# Patient Record
Sex: Female | Born: 1955 | Hispanic: Yes | Marital: Married | Smoking: Never smoker
Health system: Southern US, Community
[De-identification: ages and names within clinical notes are randomized; demographics above are authoritative.]

---

## 2007-07-15 ENCOUNTER — Ambulatory Visit: Payer: Self-pay

## 2009-02-14 ENCOUNTER — Ambulatory Visit: Payer: Self-pay | Admitting: Family Medicine

## 2011-03-04 ENCOUNTER — Ambulatory Visit: Payer: Self-pay | Admitting: Family Medicine

## 2012-04-01 ENCOUNTER — Ambulatory Visit: Payer: Self-pay

## 2013-05-05 ENCOUNTER — Ambulatory Visit: Payer: Self-pay

## 2015-03-08 ENCOUNTER — Ambulatory Visit: Payer: Self-pay | Attending: Oncology | Admitting: *Deleted

## 2015-03-08 ENCOUNTER — Ambulatory Visit
Admission: RE | Admit: 2015-03-08 | Discharge: 2015-03-08 | Disposition: A | Discharge status: Home / Self Care | Payer: Self-pay | Source: Ambulatory Visit | Attending: Oncology | Admitting: Oncology

## 2015-03-08 ENCOUNTER — Encounter: Payer: Self-pay | Admitting: *Deleted

## 2015-03-08 VITALS — BP 111/73 | HR 62 | Temp 97.7°F | Resp 18 | Ht 59.06 in | Wt 161.4 lb

## 2015-03-08 DIAGNOSIS — Z0000 Encounter for general adult medical examination without abnormal findings: Secondary | ICD-10-CM

## 2015-03-08 NOTE — Patient Instructions (Signed)
Gave patient hand-out, Women Staying Healthy, Active and Well from [facility], with education on breast health, pap smears, heart and colon health. 

## 2015-03-08 NOTE — Progress Notes (Signed)
Subjective:     Patient ID: Amanda Johns, female   DOB: 1955/08/09, 60 y.o.   MRN: [id number]  HPI   Review of Systems     Objective:   Physical Exam  Pulmonary/Chest: Right breast exhibits no inverted nipple, no mass, no nipple discharge, no skin change and no tenderness. Left breast exhibits no inverted nipple, no mass, no nipple discharge, no skin change and no tenderness. Breasts are symmetrical.         Assessment:     60 year old Hispanic female returns to [facility] for annual screening.  Maritza, the interpreter present during the interview and exam.  Clinical breast exam unremarkable.  Taught self breast awareness.  Patient has been screened for eligibility.  She does not have any insurance, Medicare or Medicaid.  She also meets financial eligibility.  Hand-out given on the Affordable Care Act.     Plan:     Screening mammogram ordered.  Will follow-up per protocol.

## 2015-03-17 ENCOUNTER — Encounter: Payer: Self-pay | Admitting: *Deleted

## 2015-03-17 NOTE — Progress Notes (Signed)
Letter mailed to inform patient of her normal mammogram results.  To follow up with annual screening in one year.  [facility] to [place].

## 2016-05-01 ENCOUNTER — Encounter: Payer: Self-pay | Admitting: *Deleted

## 2016-05-01 ENCOUNTER — Ambulatory Visit
Admission: RE | Admit: 2016-05-01 | Discharge: 2016-05-01 | Disposition: A | Discharge status: Home / Self Care | Payer: Self-pay | Source: Ambulatory Visit | Attending: Oncology | Admitting: Oncology

## 2016-05-01 ENCOUNTER — Ambulatory Visit: Payer: Self-pay | Attending: Oncology | Admitting: *Deleted

## 2016-05-01 VITALS — BP 138/76 | HR 66 | Temp 96.8°F | Ht 58.27 in | Wt 156.6 lb

## 2016-05-01 DIAGNOSIS — Z0000 Encounter for general adult medical examination without abnormal findings: Secondary | ICD-10-CM

## 2016-05-01 NOTE — Patient Instructions (Signed)
Gave patient hand-out, Women Staying Healthy, Active and Well from [facility], with education on breast health, pap smears, heart and colon health. 

## 2016-05-01 NOTE — Progress Notes (Signed)
Subjective:     Patient ID: Amanda SkinnerFelipa Tornero Johns, female   DOB: 09-04-1955, 61 y.o.   MRN: [id number]  HPI   Review of Systems     Objective:   Physical Exam  Pulmonary/Chest: Right breast exhibits no inverted nipple, no mass, no nipple discharge, no skin change and no tenderness. Left breast exhibits no inverted nipple, no mass, no nipple discharge, no skin change and no tenderness. Breasts are asymmetrical.  Pendulous breast noted.  Left breast larger than the right.       Assessment:     61 year old Hispanic [facility]  Kandis CockingMaritza, the interpreter present during the interview and exam.  Clinical breast exam unremarkable.  Taught self breast awareness.  [facility] 2017 [facility].  Patient has been screened for eligibility.  She does not have any insurance, Medicare or Medicaid.  She also meets financial eligibility.  Hand-out given on the Affordable Care Act.    Plan:     Screening mammogram ordered.  Will follow up per [facility] protocol.

## 2016-05-07 ENCOUNTER — Encounter: Payer: Self-pay | Admitting: *Deleted

## 2016-05-07 NOTE — Progress Notes (Signed)
Letter mailed from the [facility] to inform patient of her normal mammogram results.  Patient is to follow-up with annual screening in one year.  [facility] to [place]. 

## 2018-06-10 IMAGING — MG MM DIGITAL SCREENING BILAT W/ TOMO W/ CAD
6 series · 6 of 14 positions shown · non-contrast
Comparison: Previous exam(s).

CLINICAL DATA: Screening.

EXAM:
2D DIGITAL SCREENING BILATERAL MAMMOGRAM WITH CAD AND ADJUNCT TOMO

[L CC]
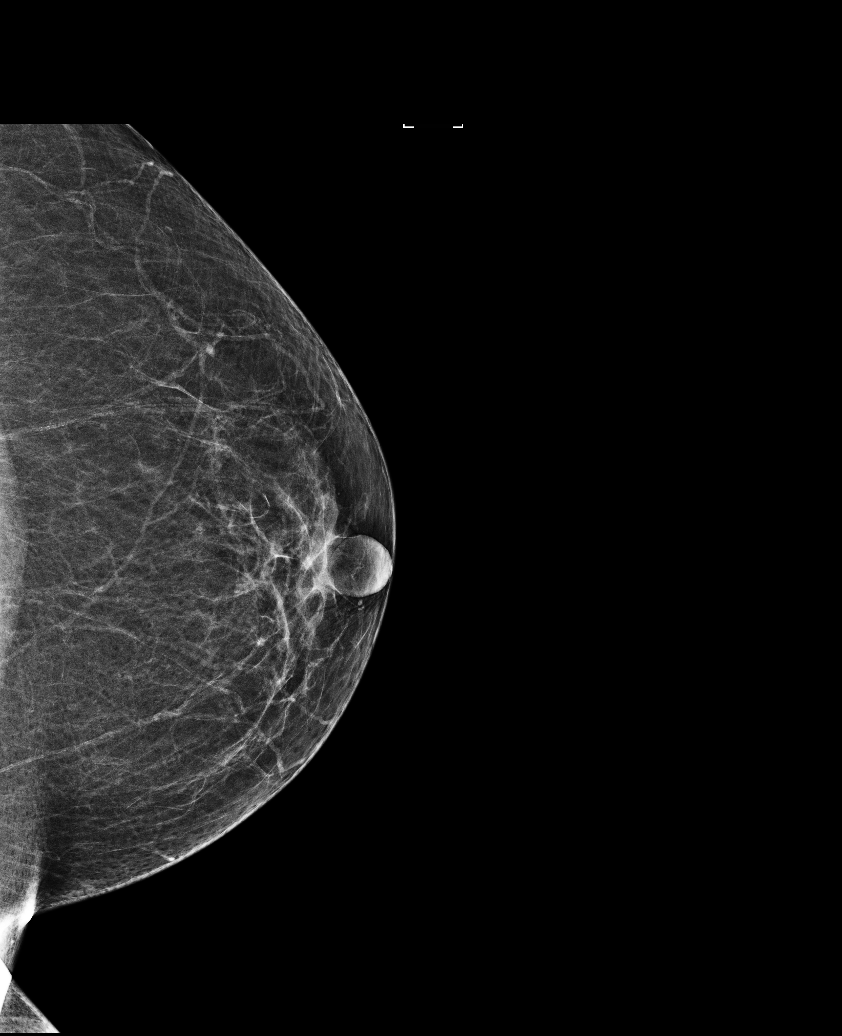

[R CC synth-2D]
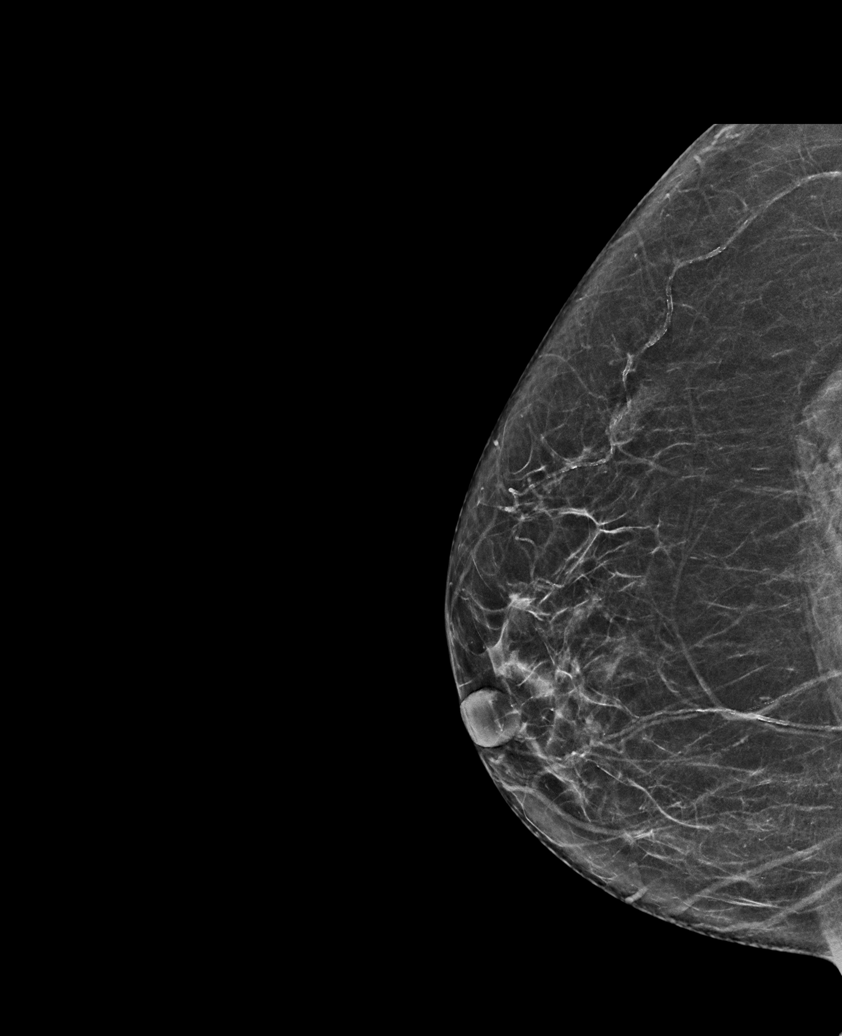

[R MLO synth-2D]
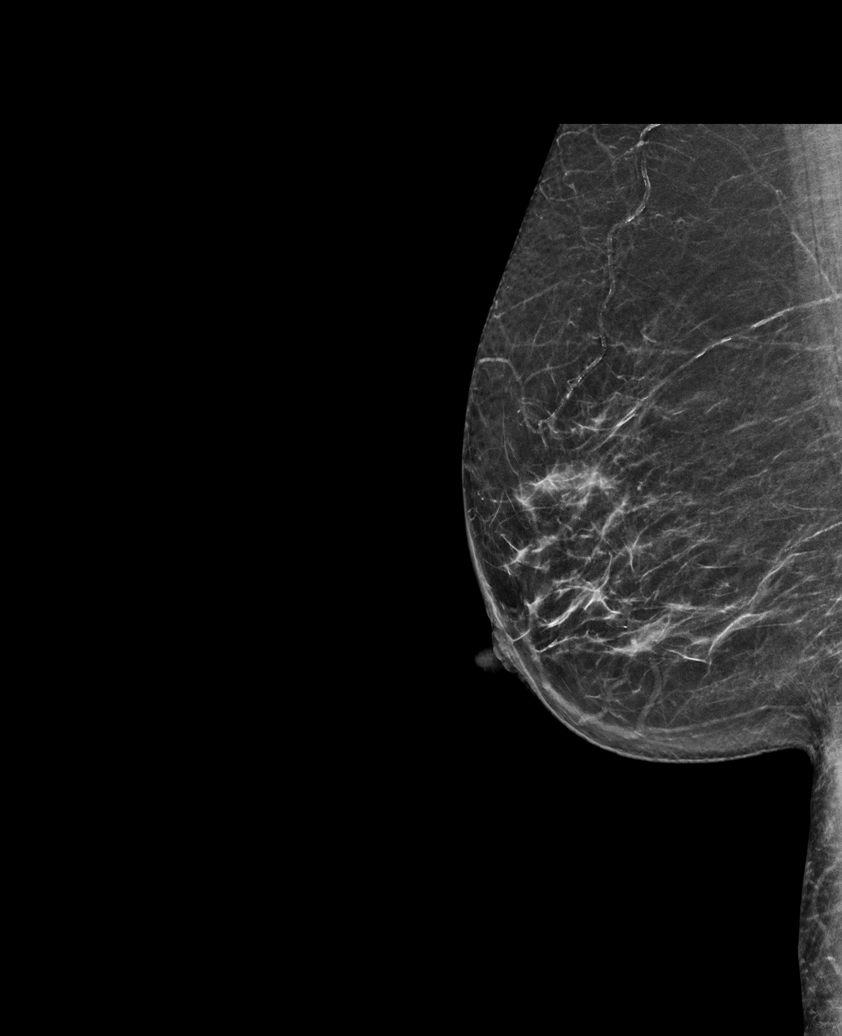

[R CC]
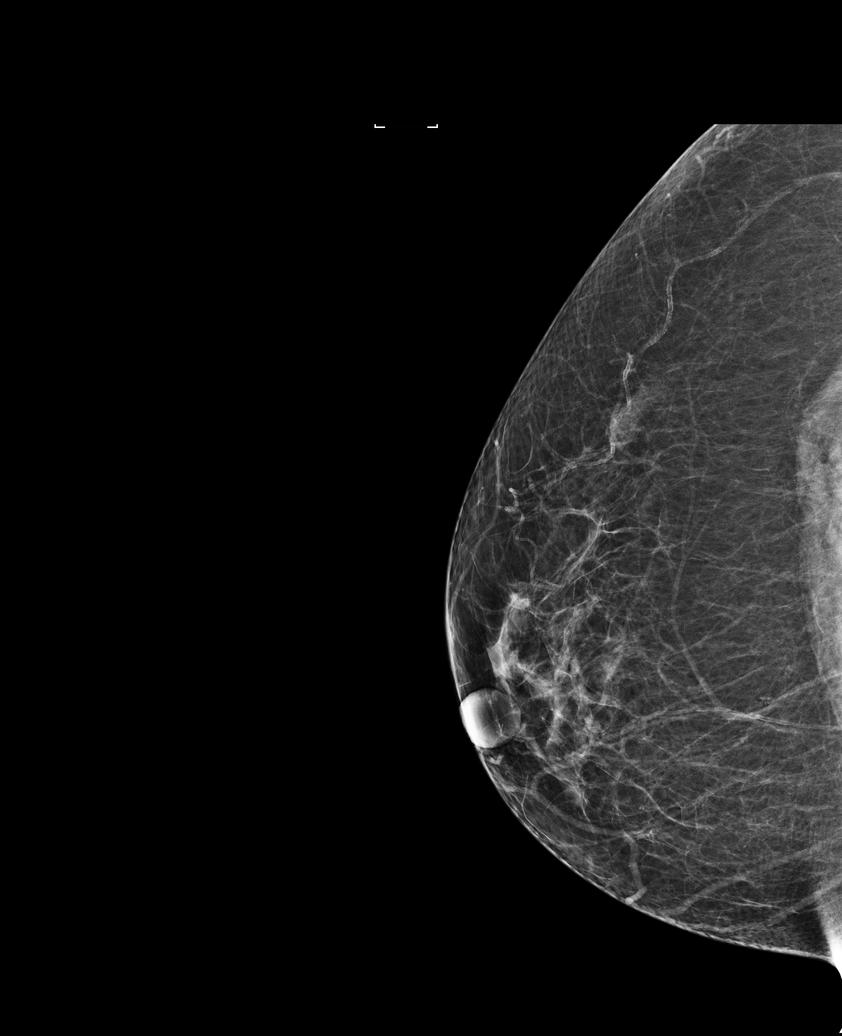

[L CC tomo · tomo slice 33/64.0]
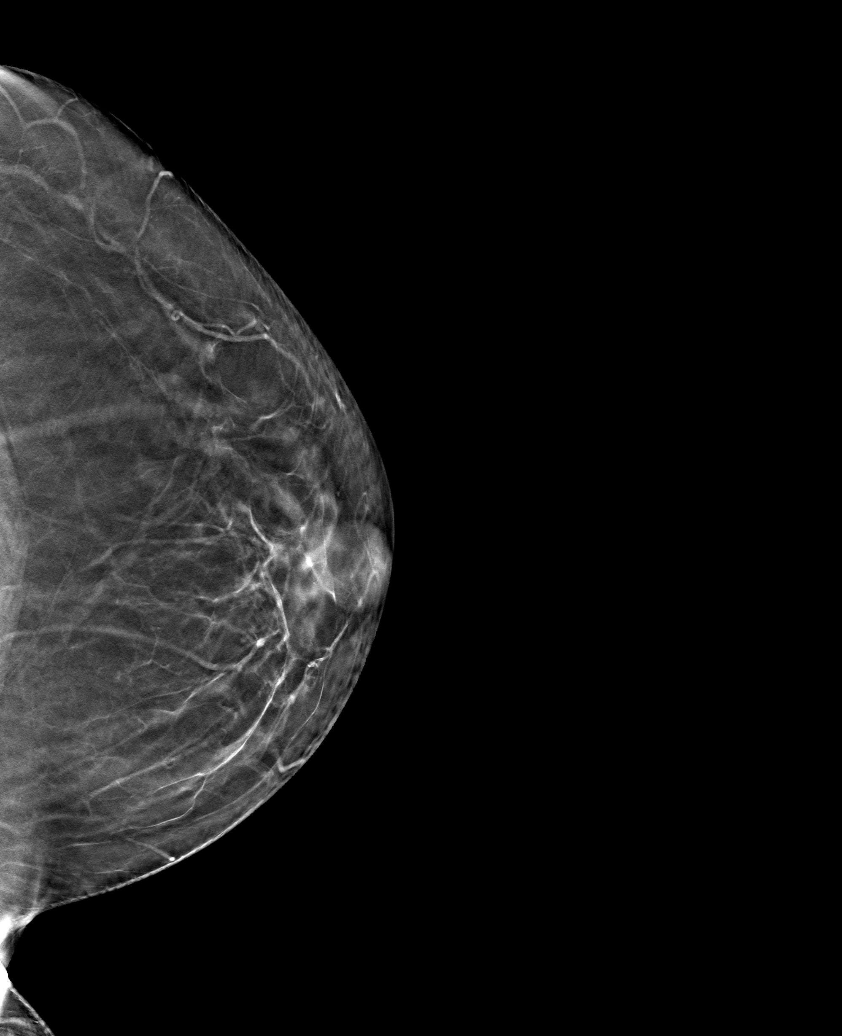

[R CC tomo · tomo slice 32/63.0]
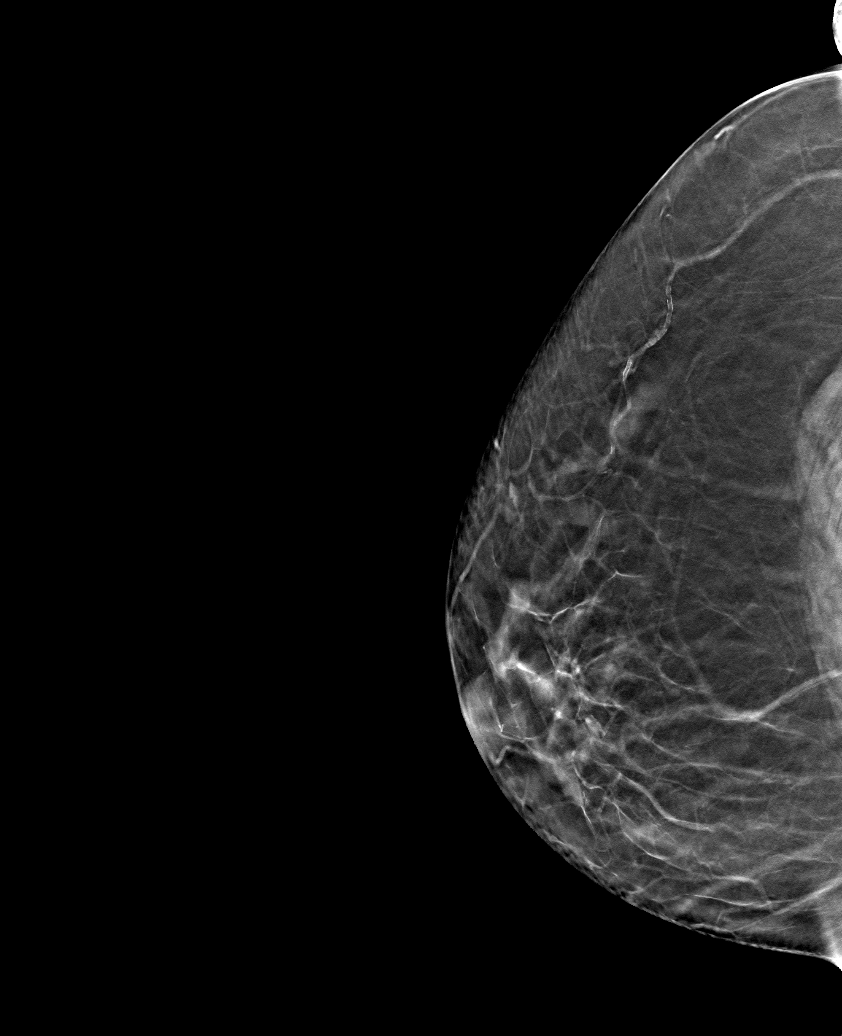

[6 of 14 positions shown; findings below may reference images not displayed]

ACR Breast Density Category b: There are scattered areas of
fibroglandular density.
FINDINGS: There are no findings suspicious for malignancy. Images were
processed with CAD.
IMPRESSION: No mammographic evidence of malignancy. A result letter of this
screening mammogram will be mailed directly to the patient.

RECOMMENDATION:
Screening mammogram in one year. (Code:97-6-RS4)

BI-RADS CATEGORY  1: Negative.

## 2019-06-09 ENCOUNTER — Ambulatory Visit
Admission: RE | Admit: 2019-06-09 | Discharge: 2019-06-09 | Disposition: A | Discharge status: Home / Self Care | Payer: Self-pay | Source: Ambulatory Visit | Attending: Oncology | Admitting: Oncology

## 2019-06-09 ENCOUNTER — Other Ambulatory Visit: Payer: Self-pay

## 2019-06-09 ENCOUNTER — Ambulatory Visit: Payer: Self-pay | Attending: Oncology

## 2019-06-09 VITALS — BP 116/75 | HR 68 | Temp 97.1°F | Ht 58.25 in | Wt 167.5 lb

## 2019-06-09 DIAGNOSIS — Z0000 Encounter for general adult medical examination without abnormal findings: Secondary | ICD-10-CM

## 2019-06-09 NOTE — Progress Notes (Signed)
  Subjective:     Patient ID: Amanda Johns, female   DOB: Sep 01, 1955, 64 y.o.   MRN: [id number]  HPI   Review of Systems     Objective:   Physical Exam Chest:     Breasts:        Right: No swelling, bleeding, inverted nipple, mass, nipple discharge, skin change or tenderness.        Left: No swelling, bleeding, inverted nipple, mass, nipple discharge, skin change or tenderness.  Genitourinary:    Labia:        Right: No rash, tenderness, lesion or injury.        Left: No rash, tenderness, lesion or injury.      Vagina: No signs of injury and foreign body. Vaginal discharge present. No erythema, tenderness, bleeding, lesions or prolapsed vaginal walls.     Cervix: No cervical motion tenderness, friability, erythema or eversion.     Uterus: Not deviated, not enlarged, not fixed, not tender and no uterine prolapse.      Adnexa:        Right: No mass, tenderness or fullness.         Left: No mass, tenderness or fullness.       Comments: Light brown vaginal discharge       Assessment:     64 year old Hispanic female patient presents for [facility] clinic visit.  Patient screened, and meets [facility] eligibility.  Patient does not have insurance, Medicare or Medicaid.  Instructed patient on breast self awareness using teach back method.  Clinical breast exam unremarkable.  Noted light brown liquid discharge on pelvic exam.      Plan:     Sent for bilateral screening mammogram. Specimen collected for pap.

## 2019-06-10 NOTE — Progress Notes (Unsigned)
Letter mailed from [facility] to notify of normal mammogram results.  Patient to return in one year for annual screening.  Pap results pending. 

## 2019-06-12 LAB — IGP, APTIMA HPV: HPV Aptima: NEGATIVE

## 2019-06-14 NOTE — Progress Notes (Signed)
Letter mailed to patient to notify of normal mammogram, and pap smear results. Next pap due in 5 years.  Copy to [facility]. 

## 2021-12-31 ENCOUNTER — Ambulatory Visit
Admission: RE | Admit: 2021-12-31 | Discharge: 2021-12-31 | Disposition: A | Discharge status: Home / Self Care | Payer: Self-pay | Source: Ambulatory Visit | Attending: Physician Assistant | Admitting: Physician Assistant

## 2021-12-31 ENCOUNTER — Ambulatory Visit
Admission: RE | Admit: 2021-12-31 | Discharge: 2021-12-31 | Disposition: A | Discharge status: Home / Self Care | Payer: Self-pay | Attending: Physician Assistant | Admitting: Physician Assistant

## 2021-12-31 ENCOUNTER — Other Ambulatory Visit: Payer: Self-pay | Admitting: Physician Assistant

## 2021-12-31 DIAGNOSIS — M25812 Other specified joint disorders, left shoulder: Secondary | ICD-10-CM

## 2022-03-23 ENCOUNTER — Emergency Department: Payer: Self-pay

## 2022-03-23 ENCOUNTER — Other Ambulatory Visit: Payer: Self-pay

## 2022-03-23 ENCOUNTER — Encounter: Payer: Self-pay | Admitting: Emergency Medicine

## 2022-03-23 ENCOUNTER — Emergency Department
Admission: EM | Admit: 2022-03-23 | Discharge: 2022-03-23 | Disposition: A | Discharge status: Home / Self Care | Payer: Self-pay | Attending: Emergency Medicine | Admitting: Emergency Medicine

## 2022-03-23 DIAGNOSIS — R42 Dizziness and giddiness: Secondary | ICD-10-CM | POA: Insufficient documentation

## 2022-03-23 DIAGNOSIS — R8289 Other abnormal findings on cytological and histological examination of urine: Secondary | ICD-10-CM | POA: Insufficient documentation

## 2022-03-23 LAB — BASIC METABOLIC PANEL WITH GFR
Anion gap: 9 (ref 5–15)
BUN: 21 mg/dL (ref 8–23)
CO2: 26 mmol/L (ref 22–32)
Calcium: 9.2 mg/dL (ref 8.9–10.3)
Chloride: 101 mmol/L (ref 98–111)
Creatinine, Ser: 0.59 mg/dL (ref 0.44–1.00)
GFR, Estimated: >60 mL/min
Glucose, Bld: 102 mg/dL — ABNORMAL HIGH (ref 70–99)
Potassium: 3.5 mmol/L (ref 3.5–5.1)
Sodium: 136 mmol/L (ref 135–145)

## 2022-03-23 LAB — URINALYSIS, ROUTINE W REFLEX MICROSCOPIC
Bacteria, UA: NONE SEEN
Bilirubin Urine: NEGATIVE
Glucose, UA: NEGATIVE mg/dL
Ketones, ur: 5 mg/dL — AB
Nitrite: NEGATIVE
Protein, ur: NEGATIVE mg/dL
Specific Gravity, Urine: 1.004 — ABNORMAL LOW (ref 1.005–1.030)
pH: 7 (ref 5.0–8.0)

## 2022-03-23 LAB — CBC
HCT: 38.1 % (ref 36.0–46.0)
Hemoglobin: 12.8 g/dL (ref 12.0–15.0)
MCH: 30.1 pg (ref 26.0–34.0)
MCHC: 33.6 g/dL (ref 30.0–36.0)
MCV: 89.6 fL (ref 80.0–100.0)
Platelets: 344 K/uL (ref 150–400)
RBC: 4.25 MIL/uL (ref 3.87–5.11)
RDW: 12.3 % (ref 11.5–15.5)
WBC: 6.5 K/uL (ref 4.0–10.5)
nRBC: 0 % (ref 0.0–0.2)

## 2022-03-23 LAB — TROPONIN I (HIGH SENSITIVITY): Troponin I (High Sensitivity): 4 ng/L (ref <18)

## 2022-03-23 LAB — BRAIN NATRIURETIC PEPTIDE: B Natriuretic Peptide: 37.7 pg/mL (ref 0.0–100.0)

## 2022-03-23 MED ORDER — ONDANSETRON HCL 4 MG/2ML IJ SOLN
4.0000 mg | Freq: Once | INTRAMUSCULAR | Status: AC
  Administered 2022-03-23: 4 mg via INTRAVENOUS
  Filled 2022-03-23: qty 2

## 2022-03-23 MED ORDER — SODIUM CHLORIDE 0.9 % IV BOLUS
1000.0000 mL | Freq: Once | INTRAVENOUS | Status: AC
  Administered 2022-03-23: 1000 mL via INTRAVENOUS

## 2022-03-23 MED ORDER — IOHEXOL 350 MG/ML SOLN
75.0000 mL | Freq: Once | INTRAVENOUS | Status: AC | PRN
  Administered 2022-03-23: 75 mL via INTRAVENOUS

## 2022-03-23 MED ORDER — PROMETHAZINE HCL 12.5 MG PO TABS: 12.5000 mg | ORAL_TABLET | Freq: Four times a day (QID) | ORAL | 0 refills | Status: AC | PRN

## 2022-03-23 NOTE — ED Provider Notes (Signed)
[facility] Provider Note    Event Date/Time   First MD Initiated Contact with Patient 03/23/22 1531     (approximate)   History   Chief Complaint Dizziness   HPI Amanda Johns is a 67 y.o. female, presents to the emergency department for evaluation of dizziness.  She states her symptoms have been on and off for the past 3 days.  Reports an intermittent ringing in her right ear as well.  She states that it occurs at random.  She went to urgent care this morning, where they gave her 3 different medications, however she is not sure what they are and is unsure what they were for.  She has not taken any of the medications and does not have them with her.  She was told that she may have "inflammation in the blood vessels of her ear".  They also provide her with a influenza shot today as well.  Since then, she was at the grocery store when she began to feel "drunk" and was unable to walk anymore.  This was associated with nausea/vomiting as well.  She states that she currently feels okay, however the symptoms continue to come and go.  Reports some mild blurred vision as well.  Denies fever/chills, chest pain, shortness of breath, abdominal pain, flank pain, diarrhea, urinary symptoms, headache, restless lesions, paresthesias, or cold sensation.  History Limitations: Spanish speaking.        Physical Exam  Triage Vital Signs: ED Triage Vitals  Enc Vitals Group     BP 03/23/22 1422 (!) 160/90     Pulse Rate 03/23/22 1422 64     Resp 03/23/22 1422 18     Temp 03/23/22 1422 98.3 F (36.8 C)     Temp Source 03/23/22 1422 Oral     SpO2 03/23/22 1422 93 %     Weight --      Height --      Head Circumference --      Peak Flow --      Pain Score 03/23/22 1424 0     Pain Loc --      Pain Edu? --      Excl. in [place]? --     Most recent vital signs: Vitals:   03/23/22 1630 03/23/22 1700  BP: (!) 140/75 125/71  Pulse: 69 74  Resp: 16 19  Temp:    SpO2:  99% 100%    General: Awake, NAD.  Skin: Warm, dry. No rashes or lesions.  Eyes: PERRL. Conjunctivae normal.  Ears: No abnormalities noted on ear exam bilaterally.  No erythema or effusions along the TMs. CV: Good peripheral perfusion.  Resp: Normal effort.  Lung sounds clear bilaterally. Abd: Soft, non-tender. No distention.  Neuro: At baseline. No gross neurological deficits.  No facial droop.  Cranial nerves II through XII intact.  Extraocular movement test showed some horizontal nystagmus.  Test of skew normal.  Head impulse test normal.  Finger-nose testing normal, the patient did appear to have some difficulty performing the task.  4/5 strength in both upper and lower extremities.  Sensation intact in all extremities.  Dix-Hallpike maneuver did not seem to elicit symptoms. Musculoskeletal: Normal ROM of all extremities.   Physical Exam    ED Results / Procedures / Treatments  Labs (all labs ordered are listed, but only abnormal results are displayed) Labs Reviewed  BASIC METABOLIC PANEL - Abnormal; Notable for the following components:      Result Value  Glucose, Bld 102 (*)    All other components within normal limits  URINALYSIS, ROUTINE W REFLEX MICROSCOPIC - Abnormal; Notable for the following components:   Color, Urine STRAW (*)    APPearance CLEAR (*)    Specific Gravity, Urine 1.004 (*)    Hgb urine dipstick SMALL (*)    Ketones, ur 5 (*)    Leukocytes,Ua SMALL (*)    All other components within normal limits  CBC  BRAIN NATRIURETIC PEPTIDE  TROPONIN I (HIGH SENSITIVITY)     EKG Sinus rhythm with arrhythmia, rate of 70, no ST segment changes, normal QRS, no QT prolongation, no significant axis deviations.    RADIOLOGY  ED Provider Interpretation: I personally reviewed and interpreted these images.  Chest x-ray shows no acute abnormalities.  CT angio shows no acute intracranial abnormalities.  CT ANGIO HEAD NECK W WO CM  Result Date: 03/23/2022 CLINICAL  DATA:  Vertigo EXAM: CT ANGIOGRAPHY HEAD AND NECK TECHNIQUE: Multidetector CT imaging of the head and neck was performed using the standard protocol during bolus administration of intravenous contrast. Multiplanar CT image reconstructions and MIPs were obtained to evaluate the vascular anatomy. Carotid stenosis measurements (when applicable) are obtained utilizing NASCET criteria, using the distal internal carotid diameter as the denominator. RADIATION DOSE REDUCTION: This exam was performed according to the departmental dose-optimization program which includes automated exposure control, adjustment of the mA and/or kV according to patient size and/or use of iterative reconstruction technique. CONTRAST:  6mL OMNIPAQUE IOHEXOL 350 MG/ML SOLN COMPARISON:  None Available. FINDINGS: CT HEAD FINDINGS Brain: No evidence of acute infarction, hemorrhage, hydrocephalus, extra-axial collection or mass lesion/mass effect. Enlarged empty. Vascular: See below Skull: Normal. Negative for fracture or focal lesion. Sinuses/Orbits: No mastoid or middle ear effusion. Paranasal sinuses are clear. Orbits are unremarkable. Other: None Review of the MIP images confirms the above findings CTA NECK FINDINGS Aortic arch: Standard branching. Imaged portion shows no evidence of aneurysm or dissection. No significant stenosis of the major arch vessel origins. Right carotid system: No evidence of dissection, stenosis (50% or greater), or occlusion. Left carotid system: No evidence of dissection, stenosis (50% or greater), or occlusion. Vertebral arteries: Codominant. No evidence of dissection, stenosis (50% or greater), or occlusion. Skeleton: Large periapical lucency along the left lateral maxillary incisor. Other neck: Negative. Upper chest: Negative. Review of the MIP images confirms the above findings CTA HEAD FINDINGS Anterior circulation: No significant stenosis, proximal occlusion, aneurysm, or vascular malformation. Posterior  circulation: The basilar artery is small in caliber. No significant stenosis, vessel occlusion aneurysm or vascular malformation. Venous sinuses: As permitted by contrast timing, patent. Anatomic variants: P2 segment of the left PCA is predominantly supplied by the left PCOM. Left P1 segment is absent. Review of the MIP images confirms the above findings IMPRESSION: 1. No acute intracranial abnormality. 2. No intracranial large vessel occlusion or significant stenosis. 3. No hemodynamically significant stenosis in the neck. Electronically Signed   By: Marin Roberts M.D.   On: 03/23/2022 18:14   DG Chest 2 View  Result Date: 03/23/2022 CLINICAL DATA:  Dizziness and weakness starting this afternoon. EXAM: CHEST - 2 VIEW COMPARISON:  None Available. FINDINGS: Shallow inspiration. Heart size and pulmonary vascularity are normal. Lungs are clear. No pleural effusions. No pneumothorax. Mediastinal contours appear intact. IMPRESSION: No active cardiopulmonary disease. Electronically Signed   By: Lucienne Capers M.D.   On: 03/23/2022 17:10    PROCEDURES:  Critical Care performed: N/A.  Procedures    MEDICATIONS  ORDERED IN ED: Medications  sodium chloride 0.9 % bolus 1,000 mL (0 mLs Intravenous Stopped 03/23/22 1845)  ondansetron (ZOFRAN) injection 4 mg (4 mg Intravenous Given 03/23/22 1615)  iohexol (OMNIPAQUE) 350 MG/ML injection 75 mL (75 mLs Intravenous Contrast Given 03/23/22 1753)     IMPRESSION / MDM / ASSESSMENT AND PLAN / ED COURSE  I reviewed the triage vital signs and the nursing notes.                              Differential diagnosis includes, but is not limited to, BPPV, vestibular neuritis, labyrinthitis, Mnire disease, posterior stroke, vestibular migraine, dehydration  ED Course Patient appears well, vitals within normal limits.  NAD.  CBC shows no leukocytosis or anemia.  BMP shows no electrolyte abnormalities or AKI.  Initial troponin 4.  EKG unremarkable.  BMP  unremarkable at 37.7.  Urinalysis shows small leukocytes, no bacteria.  In the absence of urinary symptoms, unlikely to suggest infection.  Assessment/Plan Patient presents with intermittent episodes of dizziness/vertigo associated with tinnitus in her right ear x 3 days.  She appears well clinically.  She does not have any neurological deficits to suggest TIA/CVA.  CT angiogram was reassuring.  Lab workup is overall unremarkable.  She does have some horizontal nystagmus, though does not appear to be symptomatic with Dix-Hallpike maneuver.  Suspect possible Mnire disease versus vestibular neuritis.  Nonetheless, there does not appear to be any serious or life-threatening pathology.  She was able to produce the medicine as prescribed to her, which was prednisone.  Advised her to continue taking this.  Will additionally provide her with a prescription for promethazine to help manage her symptoms.  Advised her to follow-up with ENT if her symptoms fail to improve after a few weeks.  She was amenable to this.  Will discharge.  Considered admission for this patient, but given her stable presentation and unremarkable workup, she is unlikely benefit from admission.  Provided the patient with anticipatory guidance, return precautions, and educational material. Encouraged the patient to return to the emergency department at any time if they begin to experience any new or worsening symptoms. Patient expressed understanding and agreed with the plan.   Patient's presentation is most consistent with acute complicated illness / injury requiring diagnostic workup.       FINAL CLINICAL IMPRESSION(S) / ED DIAGNOSES   Final diagnoses:  Vertigo     Rx / DC Orders   ED Discharge Orders          Ordered    promethazine (PHENERGAN) 12.5 MG tablet  Every 6 hours PRN        03/23/22 1901             Note:  This document was prepared using Dragon voice recognition software and may include  unintentional dictation errors.   Teodoro Spray, [place] 03/23/22 1901    Lucillie Garfinkel, MD 03/23/22 [phone]

## 2022-03-23 NOTE — Discharge Instructions (Addendum)
-  I suspect that your dizziness is likely related to inflammation in your inner ear.  This will likely resolve on its own.  Please continue taking the prednisone.  You may additionally take the promethazine for your symptoms.  If your symptoms fail to improve, you may follow-up with the ENT specialist listed on this page.  -Return to the emergency department anytime if you begin to experience any new or worsening symptoms.

## 2022-03-23 NOTE — ED Triage Notes (Addendum)
Patient to ED via POV for dizziness and weakness that started this afternoon. States this has happen before but gone away previously. Was seen at [facility] this AM for ear problems. Patient appears pale in color.  Ipad interpreter used for this triage.

## 2022-03-23 NOTE — ED Notes (Signed)
Pt to ED POV with husband for nausea and dizziness since about 30-60 minutes ago. This has happened several times in the past, pt also has R ear problem (wax buildup) and was seen this AM at outpt clinic for this and also received influenza vaccine this morning. Pt was just dry heaving a few minutes ago.  Appears pale. Spanish speaking. Denies pain. No SOB.

## 2022-08-16 ENCOUNTER — Emergency Department: Payer: Self-pay

## 2022-08-16 ENCOUNTER — Other Ambulatory Visit: Payer: Self-pay

## 2022-08-16 ENCOUNTER — Emergency Department
Admission: EM | Admit: 2022-08-16 | Discharge: 2022-08-16 | Disposition: A | Discharge status: Home / Self Care | Payer: Self-pay | Attending: Emergency Medicine | Admitting: Emergency Medicine

## 2022-08-16 DIAGNOSIS — R42 Dizziness and giddiness: Secondary | ICD-10-CM | POA: Insufficient documentation

## 2022-08-16 LAB — DIFFERENTIAL
Abs Immature Granulocytes: 0.03 10*3/uL (ref 0.00–0.07)
Basophils Absolute: 0.1 10*3/uL (ref 0.0–0.1)
Basophils Relative: 1 %
Eosinophils Absolute: 0.1 10*3/uL (ref 0.0–0.5)
Eosinophils Relative: 2 %
Immature Granulocytes: 0 %
Lymphocytes Relative: 31 %
Lymphs Abs: 2.1 10*3/uL (ref 0.7–4.0)
Monocytes Absolute: 0.4 10*3/uL (ref 0.1–1.0)
Monocytes Relative: 5 %
Neutro Abs: 4.1 10*3/uL (ref 1.7–7.7)
Neutrophils Relative %: 61 %

## 2022-08-16 LAB — PROTIME-INR
INR: 1 (ref 0.8–1.2)
Prothrombin Time: 13 seconds (ref 11.4–15.2)

## 2022-08-16 LAB — COMPREHENSIVE METABOLIC PANEL
ALT: 21 U/L (ref 0–44)
AST: 30 U/L (ref 15–41)
Albumin: 4.3 g/dL (ref 3.5–5.0)
Alkaline Phosphatase: 89 U/L (ref 38–126)
Anion gap: 9 (ref 5–15)
BUN: 19 mg/dL (ref 8–23)
CO2: 26 mmol/L (ref 22–32)
Calcium: 9.4 mg/dL (ref 8.9–10.3)
Chloride: 104 mmol/L (ref 98–111)
Creatinine, Ser: 0.68 mg/dL (ref 0.44–1.00)
GFR, Estimated: >60 mL/min (ref >60)
Glucose, Bld: 96 mg/dL (ref 70–99)
Potassium: 3.8 mmol/L (ref 3.5–5.1)
Sodium: 139 mmol/L (ref 135–145)
Total Bilirubin: 0.7 mg/dL (ref 0.3–1.2)
Total Protein: 7.6 g/dL (ref 6.5–8.1)

## 2022-08-16 LAB — CBC
HCT: 39.7 % (ref 36.0–46.0)
Hemoglobin: 13.1 g/dL (ref 12.0–15.0)
MCH: 30.1 pg (ref 26.0–34.0)
MCHC: 33 g/dL (ref 30.0–36.0)
MCV: 91.3 fL (ref 80.0–100.0)
Platelets: 310 10*3/uL (ref 150–400)
RBC: 4.35 MIL/uL (ref 3.87–5.11)
RDW: 12.2 % (ref 11.5–15.5)
WBC: 6.8 10*3/uL (ref 4.0–10.5)
nRBC: 0 % (ref 0.0–0.2)

## 2022-08-16 LAB — APTT: aPTT: 25 seconds (ref 24–36)

## 2022-08-16 LAB — ETHANOL: Alcohol, Ethyl (B): <10 mg/dL (ref <10)

## 2022-08-16 MED ORDER — FLUTICASONE PROPIONATE 50 MCG/ACT NA SUSP: 1.0000 | Freq: Two times a day (BID) | NASAL | 3 refills | Status: AC

## 2022-08-16 MED ORDER — MECLIZINE HCL 50 MG PO TABS: 50.0000 mg | ORAL_TABLET | Freq: Three times a day (TID) | ORAL | 3 refills | Status: AC | PRN

## 2022-08-16 MED ORDER — TRIAMCINOLONE ACETONIDE 0.1 % EX CREA: 1.0000 | TOPICAL_CREAM | Freq: Four times a day (QID) | CUTANEOUS | 0 refills | Status: AC

## 2022-08-16 MED ORDER — SODIUM CHLORIDE 0.9% FLUSH: 3.0000 mL | Freq: Once | INTRAVENOUS | Status: DC

## 2022-08-16 MED ORDER — CETIRIZINE HCL 10 MG PO TABS: 10.0000 mg | ORAL_TABLET | Freq: Every day | ORAL | 3 refills | Status: AC

## 2022-08-16 MED ORDER — MECLIZINE HCL 25 MG PO TABS
50.0000 mg | ORAL_TABLET | Freq: Once | ORAL | Status: AC
  Administered 2022-08-16: 50 mg via ORAL
  Filled 2022-08-16: qty 2

## 2022-08-16 NOTE — ED Triage Notes (Addendum)
Pt to ed from home via POV for dizziness and vomiting. Pt keeps her eyes closed in triage and not speaking to triage nurse. Since 2pm today. Pt is NEG for [facility] stroke screen in triage. Pt is alert and able to follow commands.   Pt was seen in feb for same symptoms and was diagnosed with vertigo. Pt states this feels the same and does not have any PO meds at home for vertigo.

## 2022-08-16 NOTE — ED Provider Notes (Signed)
[facility] Provider Note  Patient Contact: 7:39 PM (approximate)   History   Dizziness   HPI  Amanda Johns is a 67 y.o. female who presents emergency department complaining of vertigo-like symptoms.  Patient has a history of vertigo, states that she started becoming dizzy, lightheaded.  No syncopal episodes.  She denies any headache, visual changes, chest pain, shortness of breath.  Patient states that she has had a prescription in the past for vertigo, does not have any of this medication anymore so she has not taken any medicines.     Physical Exam   Triage Vital Signs: ED Triage Vitals [08/16/22 1612]  Enc Vitals Group     BP (!) 149/84     Pulse Rate 64     Resp 16     Temp 98.4 F (36.9 C)     Temp src      SpO2 98 %     Weight 167 lb 8.8 oz (76 kg)     Height 4\' 10"  (1.473 m)     Head Circumference      Peak Flow      Pain Score 0     Pain Loc      Pain Edu?      Excl. in [place]?     Most recent vital signs: Vitals:   08/16/22 1612 08/16/22 1943  BP: (!) 149/84 125/71  Pulse: 64 64  Resp: 16 16  Temp: 98.4 F (36.9 C) 98.6 F (37 C)  SpO2: 98% 98%     General: Alert and in no acute distress. Eyes:  PERRL. EOMI. ENT:      Ears:       Nose: No congestion/rhinnorhea.      Mouth/Throat: Mucous membranes are moist Neck: No stridor. No cervical spine tenderness to palpation.  Cardiovascular:  Good peripheral perfusion Respiratory: Normal respiratory effort without tachypnea or retractions. Lungs CTAB. Good air entry to the bases with no decreased or absent breath sounds. Musculoskeletal: Full range of motion to all extremities.  Neurologic:  No gross focal neurologic deficits are appreciated.  Cranial nerves II through XII grossly intact. Skin:   No rash noted Other:   ED Results / Procedures / Treatments   Labs (all labs ordered are listed, but only abnormal results are displayed) Labs Reviewed  PROTIME-INR   APTT  CBC  DIFFERENTIAL  COMPREHENSIVE METABOLIC PANEL  ETHANOL     EKG     RADIOLOGY  I personally viewed, evaluated, and interpreted these images as part of my medical decision making, as well as reviewing the written report by the radiologist.  ED Provider Interpretation: No acute abnormality on CT scan of the head.  CT HEAD WO CONTRAST  Result Date: 08/16/2022 CLINICAL DATA:  Vertigo, central EXAM: CT HEAD WITHOUT CONTRAST TECHNIQUE: Contiguous axial images were obtained from the base of the skull through the vertex without intravenous contrast. RADIATION DOSE REDUCTION: This exam was performed according to the departmental dose-optimization program which includes automated exposure control, adjustment of the mA and/or kV according to patient size and/or use of iterative reconstruction technique. COMPARISON:  03/23/2022 FINDINGS: Brain: No acute intracranial abnormality. Specifically, no hemorrhage, hydrocephalus, mass lesion, acute infarction, or significant intracranial injury. Vascular: No hyperdense vessel or unexpected calcification. Skull: No acute calvarial abnormality. Sinuses/Orbits: No acute findings Other: None IMPRESSION: No acute intracranial abnormality. Electronically Signed   By: Charlett Nose M.D.   On: 08/16/2022 16:57    PROCEDURES:  Critical  Care performed: No  Procedures   MEDICATIONS ORDERED IN ED: Medications  sodium chloride flush (NS) 0.9 % injection 3 mL ( Intravenous Canceled Entry 08/16/22 1650)  meclizine (ANTIVERT) tablet 50 mg (has no administration in time range)     IMPRESSION / MDM / ASSESSMENT AND PLAN / ED COURSE  I reviewed the triage vital signs and the nursing notes.                                 Differential diagnosis includes, but is not limited to, vertigo, CVA, eustachian tube dysfunction  Patient's presentation is most consistent with acute presentation with potential threat to life or bodily function.   Patient's  diagnosis is consistent with vertigo.  Patient presents emergency department with vertigo-like symptoms.  Has a history of same and states that the symptoms feel consistent with her previous vertigo.  She does not have any medication at home and presents to the ED for evaluation and hopefully medications.  Patient had labs, imaging which is reassuring.  Based off of the patient's symptoms, history I do feel that this is likely vertigo.  Will treat symptomatically with meclizine.  Patient also has had some intermittent issues with the ear and its appears that she has some eustachian tube dysfunction.  Will also prescribe Flonase and Zyrtec for same.  At time of discharge patient also mentioned that she had what appeared to be some bug bites to her arm that were pruritic so I will prescribe triamcinolone for same..  Follow-up primary care as needed.  Patient is given ED precautions to return to the ED for any worsening or new symptoms.     FINAL CLINICAL IMPRESSION(S) / ED DIAGNOSES   Final diagnoses:  Vertigo     Rx / DC Orders   ED Discharge Orders          Ordered    meclizine (ANTIVERT) 50 MG tablet  3 times daily PRN        08/16/22 1958    fluticasone (FLONASE) 50 MCG/ACT nasal spray  2 times daily        08/16/22 1958    cetirizine (ZYRTEC) 10 MG tablet  Daily        08/16/22 1958    triamcinolone cream (KENALOG) 0.1 %  4 times daily        08/16/22 1958             Note:  This document was prepared using Dragon voice recognition software and may include unintentional dictation errors.   Lanette Hampshire 08/16/22 2000    Pilar Jarvis, MD 08/17/22 2028

## 2022-08-16 NOTE — ED Notes (Signed)
Amanda Johns [phone] Spanish interpreter for discharge

## 2022-09-09 ENCOUNTER — Encounter: Payer: Self-pay | Admitting: Family Medicine

## 2022-11-14 ENCOUNTER — Other Ambulatory Visit: Payer: Self-pay

## 2022-11-14 DIAGNOSIS — Z1231 Encounter for screening mammogram for malignant neoplasm of breast: Secondary | ICD-10-CM

## 2022-12-02 ENCOUNTER — Ambulatory Visit: Payer: Self-pay | Attending: Hematology and Oncology | Admitting: Hematology and Oncology

## 2022-12-02 ENCOUNTER — Ambulatory Visit
Admission: RE | Admit: 2022-12-02 | Discharge: 2022-12-02 | Disposition: A | Discharge status: Home / Self Care | Payer: Self-pay | Source: Ambulatory Visit | Attending: Obstetrics and Gynecology | Admitting: Obstetrics and Gynecology

## 2022-12-02 VITALS — BP 135/80 | Wt 160.5 lb

## 2022-12-02 DIAGNOSIS — Z1231 Encounter for screening mammogram for malignant neoplasm of breast: Secondary | ICD-10-CM

## 2022-12-02 NOTE — Patient Instructions (Signed)
Taught Amanda Johns how to perform BSE and gave educational materials to take home. Patient did not need a Pap smear today due to last Pap smear was in 06/09/2019 per patient.  Let her know [facility] will cover Pap smears every 5 years unless has a history of abnormal Pap smears. Referred patient to the [facility] for screening mammogram. Appointment scheduled for 12/02/2022. Patient aware of appointment and will be there. Let patient know will follow up with her within the next couple weeks with results. Amanda Johns verbalized understanding.  Pascal Lux, NP 1:10 PM

## 2022-12-02 NOTE — Progress Notes (Signed)
[name] is a 67 y.o. female who presents to [facility] clinic today with no complaints.    Pap Smear: Pap not smear completed today. Last Pap smear was 06/09/2019 at [facility] and was normal. Per patient has no history of an abnormal Pap smear. Last Pap smear result is available in Epic.   Physical exam: Breasts Breasts symmetrical. No skin abnormalities bilateral breasts. No nipple retraction bilateral breasts. No nipple discharge bilateral breasts. No lymphadenopathy. No lumps palpated bilateral breasts. MS DIGITAL SCREENING TOMO BILATERAL  Result Date: 06/10/2019 CLINICAL DATA:  Screening. EXAM: DIGITAL SCREENING BILATERAL MAMMOGRAM WITH TOMO AND CAD COMPARISON:  Previous exam(s). ACR Breast Density Category b: There are scattered areas of fibroglandular density. FINDINGS: There are no findings suspicious for malignancy. Images were processed with CAD. IMPRESSION: No mammographic evidence of malignancy. A result letter of this screening mammogram will be mailed directly to the patient. RECOMMENDATION: Screening mammogram in one year. (Code:[id number]-01Y) BI-RADS CATEGORY  1: Negative. Electronically Signed   By: Amanda Johns M.D.   On: 06/10/2019 09:56          Pelvic/Bimanual Pap is not indicated today    Smoking History: Patient has never smoked and was not referred to quit line.    Patient Navigation: Patient education provided. Access to services provided for patient through [facility] program. Amanda Johns interpreter provided. No transportation provided   Colorectal Cancer Screening: Per patient has never had colonoscopy completed No complaints today. Declined FIT testing due to referral to GI from PCP.   Breast and Cervical Cancer Risk Assessment: Patient does not have family history of breast cancer, known genetic mutations, or radiation treatment to the chest before age 30. Patient does not have history of cervical dysplasia, immunocompromised, or DES  exposure in-utero.  Risk Scores as of Encounter on 12/02/2022     Amanda Johns           5-year 1.21%   Lifetime 4.38%   This patient is [place] but has no documented birth country, so the [employer] model used data from [place] patients to calculate their risk score. Document a birth country in the Demographics activity for a more accurate score.         Last calculated by Narda Rutherford, LPN on 16/11/9602 at  1:26 PM          A: [facility] exam without pap smear No complaints with benign exam.   P: Referred patient to [facility] for a screening mammogram. Appointment scheduled 12/02/22.  Ilda Basset A, NP 12/02/2022 1:10 PM

## 2023-11-26 ENCOUNTER — Telehealth: Payer: Self-pay
